# Patient Record
Sex: Male | Born: 1990 | Race: White | Hispanic: No | Marital: Single | Smoking: Never smoker
Health system: Southern US, Community
[De-identification: ages and names within clinical notes are randomized; demographics above are authoritative.]

---

## 2011-09-30 ENCOUNTER — Emergency Department: Payer: Self-pay | Admitting: *Deleted

## 2011-10-08 ENCOUNTER — Emergency Department: Payer: Self-pay | Admitting: Emergency Medicine
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
## 2018-04-12 ENCOUNTER — Encounter: Payer: Self-pay | Admitting: Emergency Medicine

## 2018-04-12 ENCOUNTER — Emergency Department
Admission: EM | Admit: 2018-04-12 | Discharge: 2018-04-12 | Disposition: A | Discharge status: Other Acute Inpatient Hospital | Payer: Managed Care, Other (non HMO) | Attending: Emergency Medicine | Admitting: Emergency Medicine

## 2018-04-12 ENCOUNTER — Other Ambulatory Visit: Payer: Self-pay

## 2018-04-12 DIAGNOSIS — S01112A Laceration without foreign body of left eyelid and periocular area, initial encounter: Secondary | ICD-10-CM | POA: Diagnosis not present

## 2018-04-12 DIAGNOSIS — W01198A Fall on same level from slipping, tripping and stumbling with subsequent striking against other object, initial encounter: Secondary | ICD-10-CM | POA: Insufficient documentation

## 2018-04-12 DIAGNOSIS — Y999 Unspecified external cause status: Secondary | ICD-10-CM | POA: Insufficient documentation

## 2018-04-12 DIAGNOSIS — Y93E1 Activity, personal bathing and showering: Secondary | ICD-10-CM | POA: Insufficient documentation

## 2018-04-12 DIAGNOSIS — Y92012 Bathroom of single-family (private) house as the place of occurrence of the external cause: Secondary | ICD-10-CM | POA: Insufficient documentation

## 2018-04-12 LAB — BASIC METABOLIC PANEL
Anion gap: 12 (ref 5–15)
BUN: 8 mg/dL (ref 6–20)
CHLORIDE: 103 mmol/L (ref 101–111)
CO2: 25 mmol/L (ref 22–32)
CREATININE: 0.96 mg/dL (ref 0.61–1.24)
Calcium: 9 mg/dL (ref 8.9–10.3)
Glucose, Bld: 99 mg/dL (ref 65–99)
POTASSIUM: 3.9 mmol/L (ref 3.5–5.1)
SODIUM: 140 mmol/L (ref 135–145)

## 2018-04-12 LAB — CBC WITH DIFFERENTIAL/PLATELET
BASOS ABS: 0 10*3/uL (ref 0–0.1)
Basophils Relative: 0 %
EOS ABS: 0.1 10*3/uL (ref 0–0.7)
Eosinophils Relative: 1 %
HCT: 49.7 % (ref 40.0–52.0)
HEMOGLOBIN: 17.2 g/dL (ref 13.0–18.0)
LYMPHS ABS: 1.8 10*3/uL (ref 1.0–3.6)
LYMPHS PCT: 21 %
MCH: 32.3 pg (ref 26.0–34.0)
MCHC: 34.5 g/dL (ref 32.0–36.0)
MCV: 93.7 fL (ref 80.0–100.0)
Monocytes Absolute: 0.4 10*3/uL (ref 0.2–1.0)
Monocytes Relative: 4 %
NEUTROS PCT: 74 %
Neutro Abs: 6 10*3/uL (ref 1.4–6.5)
Platelets: 195 10*3/uL (ref 150–440)
RBC: 5.31 MIL/uL (ref 4.40–5.90)
RDW: 13.6 % (ref 11.5–14.5)
WBC: 8.3 10*3/uL (ref 3.8–10.6)

## 2018-04-12 MED ORDER — LIDOCAINE-EPINEPHRINE (PF) 1 %-1:200000 IJ SOLN
30.0000 mL | Freq: Once | INTRAMUSCULAR | Status: AC
  Administered 2018-04-12: 30 mL via INTRADERMAL

## 2018-04-12 MED ORDER — LIDOCAINE-EPINEPHRINE (PF) 1 %-1:200000 IJ SOLN
INTRAMUSCULAR | Status: AC
  Administered 2018-04-12: 30 mL via INTRADERMAL
  Filled 2018-04-12: qty 30

## 2018-04-12 MED ORDER — LORAZEPAM 2 MG/ML IJ SOLN
2.0000 mg | Freq: Once | INTRAMUSCULAR | Status: AC
  Administered 2018-04-12: 2 mg via INTRAVENOUS
  Filled 2018-04-12: qty 1

## 2018-04-12 MED ORDER — ERYTHROMYCIN 5 MG/GM OP OINT
TOPICAL_OINTMENT | Freq: Once | OPHTHALMIC | Status: AC
  Administered 2018-04-12: 07:00:00 via OPHTHALMIC
  Filled 2018-04-12: qty 1

## 2018-04-12 NOTE — ED Notes (Signed)
Ointment and 2x2 placed on left eye for protection.

## 2018-04-12 NOTE — ED Notes (Signed)
Pt alert and oriented X4, active, cooperative, pt in NAD. RR even and unlabored, color WNL.  All of belongings with patient.

## 2018-04-12 NOTE — ED Triage Notes (Addendum)
Pt fell in shower and towel rack caught his left lower eye lid, lacerations noted to each side of lower lid. Pt does have etoh on board, also some marijuana and cocaine.

## 2018-04-12 NOTE — ED Notes (Signed)
Due to laceration found to be affecting tear duct, per Brooke DareKing, MD at bedside, [facility].

## 2018-04-12 NOTE — ED Notes (Signed)
EMTALA checked by this RN 

## 2018-04-12 NOTE — ED Provider Notes (Signed)
[facility] Emergency Department Provider Note  ____________________________________________   First MD Initiated Contact with Patient 04/12/18 [phone]     (approximate)  I have reviewed the triage vital signs and the nursing notes.   HISTORY  Chief Complaint Laceration    HPI Joel Nunez is a 27 y.o. male who presents by private vehicle for evaluation of lacerations to his left lower eyelid.  Somehow when he was in the bathroom he slipped and caught his left lower eyelid on a towel rack which caused 2 separate lacerations, one on the medial aspect one on the lateral aspect.  The eyeball itself does not hurt in fact he said he has no pain to the eyelid, but he feels that it is hanging loose and it is bothering him in that regard.  His vision is not impaired.  He did not lose consciousness and has no headache or neck pain.  He admits to using cocaine, marijuana, and alcohol earlier this evening.  He denies fever/chills, chest pain, shortness of breath, nausea, vomiting, and abdominal pain.  Bleeding is well controlled at this time.  History reviewed. No pertinent past medical history.  There are no active problems to display for this patient.   History reviewed. No pertinent surgical history.  Prior to Admission medications   Not on File    Allergies Patient has no known allergies.  History reviewed. No pertinent family history.  Social History Social History   Tobacco Use  . Smoking status: Never Smoker  . Smokeless tobacco: Current User    Types: Snuff  Substance Use Topics  . Alcohol use: Not on file  . Drug use: Yes    Types: Cocaine, Marijuana    Comment: occasional drug use    Review of Systems Constitutional: No fever/chills Eyes: No visual impairment in spite of injury to left lower eyelid Cardiovascular: Denies chest pain. Respiratory: Denies shortness of breath. Gastrointestinal: No abdominal pain.  No nausea, no vomiting.  No  diarrhea.  No constipation. Genitourinary: Negative for dysuria. Musculoskeletal: Negative for neck pain.  Negative for back pain. Integumentary: 2 lacerations to left lower eyelid involving both the medial and lateral aspect. Neurological: Negative for headaches, focal weakness or numbness.   ____________________________________________   PHYSICAL EXAM:  VITAL SIGNS: ED Triage Vitals [04/12/18 0401]  Enc Vitals Group     BP 133/86     Pulse Rate (!) 118     Resp 20     Temp 98.2 F (36.8 C)     Temp Source Oral     SpO2 96 %     Weight 88.5 kg (195 lb)     Height 1.956 m (6\' 5" )     Head Circumference      Peak Flow      Pain Score 3     Pain Loc      Pain Edu?      Excl. in GC?     Constitutional: Alert and oriented. Well appearing and in no acute distress but very anxious. Head: Some bruising below the left eye and laceration to the left lower eyelid as documented below Nose: No congestion/rhinnorhea. Mouth/Throat: Mucous membranes are moist. Neck: No stridor.  No meningeal signs.   Cardiovascular: Normal rate, regular rhythm. Good peripheral circulation. Grossly normal heart sounds. Respiratory: Normal respiratory effort.  No retractions. Lungs CTAB. Gastrointestinal: Soft and nontender. No distention.  Musculoskeletal: No lower extremity tenderness nor edema. No gross deformities of extremities. Neurologic:  Normal  speech and language. No gross focal neurologic deficits are appreciated.  Skin:  Skin is warm, dry and intact. No rash noted. Psychiatric: Mood and affect are anxious and agitated but understandable under the circumstances  eyes: Small amount of ecchymosis as it with a contusion to the infraorbital region of the left eye.  He has a 1 cm laceration involving the lateral canthus and approximately 1 cm laceration near the lower punctum on the medial aspect of the left lower eye.  There is no tenderness to palpation of the globe and his pupils are equal and  reactive bilaterally with no evidence of globe injury.  He does have some conjunctival hemorrhage just below the iris as documented in the picture below     ____________________________________________   LABS (all labs ordered are listed, but only abnormal results are displayed)  Labs Reviewed  BASIC METABOLIC PANEL  CBC WITH DIFFERENTIAL/PLATELET   ____________________________________________  EKG  No indication for EKG ____________________________________________  RADIOLOGY   ED MD interpretation: No emergent imaging indicated  Official radiology report(s): No results found.  ____________________________________________   PROCEDURES  Critical Care performed: No   Procedure(s) performed:   Procedures   ____________________________________________   INITIAL IMPRESSION / ASSESSMENT AND PLAN / ED COURSE  As part of my medical decision making, I reviewed the following data within the electronic MEDICAL RECORD NUMBER Nursing notes reviewed and incorporated, Labs reviewed , Discussed with admitting physician at [facility] and A consult was requested and obtained from this/these consultant(s) (Dr. Brooke Dare with ophthalmology)    Differential diagnosis includes, but is not limited to, globe injury, left lower lid laceration that may or may not involve the canaliculus.  I have a low suspicion for globe injury based on his physical exam but I am concerned about the severity of the injury to both the medial and lateral aspects of the left lower eyelid.  I will contact ophthalmology and asked them to come to the emergency department to evaluate him in person and see if a bedside repair would be possible.  Patient will remain n.p.o.  He will require anxiolysis for the exam but does not require any acute treatment at this time.  Clinical Course as of Apr 12 726  Fri Apr 12, 2018  0430 Spoke by phone with Dr. Brooke Dare of ophthalmology.  Explained the case and explained why I felt it was  appropriate to obtain ophthalmology consult.  He understands and will come in to evaluate the patient in person in the emergency department.   [CF]  0540 Dr. Brooke Dare is present in the ED and has evaluated the patient in person.  We discussed the case in person.  He is going to repair the eyelid at bedside.  Providing Ativan 2 mg IV for anxiolysis, will proceed with additional procedural sedation if this is insufficient.   [CF]  1478 Dr. Brooke Dare has been able to evaluate the patient carefully and was able to determine that the patient does have a laceration on the inner part of the lower lid that starts lateral to the lower punctum but extends through the lower canaliculus.  Dr. Brooke Dare feels that this requires oculoplastics intervention and that he may need surgery under general anesthesia rather than a bedside procedure, but regardless he needs a higher level of care and Dr. Brooke Dare recommended transfer to [facility].  I have spoken with the [facility] transfer center and they are paging oculoplastics and are going to call me back.Dr. Brooke Dare recommended that we apply erythromycin ophthalmic  ointment but hold off on IV antibiotics.  I have ordered basic labs.  The patient needs no other intervention at this point but I have asked that he remain n.p.o. given the possibility of general anesthesia.   [CF]  [phone] Dr. Brooke Dare and I spoke with the ophthalmologist at [facility], Dr. Shon Baton, who accepted the patient on behalf of Dr. Judson Roch as an ED to ED transfer.  We will arrange transportation by local EMS.  There is no indication for further management beyond the erythromycin ointment as described above.  EMTALA documentation has been updated.   [CF]  0700 Pending transfer to [facility]   [SS]    Clinical Course User Index [CF] Loleta Rose, MD [SS] Dionne Bucy, MD    ____________________________________________  FINAL CLINICAL IMPRESSION(S) / ED DIAGNOSES  Final diagnoses:  Eyelid laceration, canaliculus, left, initial encounter      MEDICATIONS GIVEN DURING THIS VISIT:  Medications  LORazepam (ATIVAN) injection 2 mg (2 mg Intravenous Given 04/12/18 0549)  lidocaine-EPINEPHrine (XYLOCAINE-EPINEPHrine) 1 %-1:200000 (PF) injection 30 mL (30 mLs Intradermal Given 04/12/18 0549)  erythromycin ophthalmic ointment ( Left Eye Given 04/12/18 0710)     ED Discharge Orders    None       Note:  This document was prepared using Dragon voice recognition software and may include unintentional dictation errors.    Loleta Rose, MD 04/12/18 [phone]
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
